# Patient Record
Sex: Male | Born: 1980 | Race: White | Marital: Single | Smoking: Current every day smoker
Health system: Southern US, Community
[De-identification: ages and names within clinical notes are randomized; demographics above are authoritative.]

---

## 2015-02-26 ENCOUNTER — Emergency Department: Payer: No Typology Code available for payment source

## 2015-02-26 ENCOUNTER — Encounter: Payer: Self-pay | Admitting: Emergency Medicine

## 2015-02-26 ENCOUNTER — Emergency Department
Admission: EM | Admit: 2015-02-26 | Discharge: 2015-02-26 | Disposition: A | Discharge status: Home / Self Care | Payer: No Typology Code available for payment source | Attending: Student | Admitting: Student

## 2015-02-26 DIAGNOSIS — S70312A Abrasion, left thigh, initial encounter: Secondary | ICD-10-CM | POA: Insufficient documentation

## 2015-02-26 DIAGNOSIS — Y9389 Activity, other specified: Secondary | ICD-10-CM | POA: Insufficient documentation

## 2015-02-26 DIAGNOSIS — S50312A Abrasion of left elbow, initial encounter: Secondary | ICD-10-CM | POA: Insufficient documentation

## 2015-02-26 DIAGNOSIS — S40022A Contusion of left upper arm, initial encounter: Secondary | ICD-10-CM

## 2015-02-26 DIAGNOSIS — Z720 Tobacco use: Secondary | ICD-10-CM | POA: Insufficient documentation

## 2015-02-26 DIAGNOSIS — Y92410 Unspecified street and highway as the place of occurrence of the external cause: Secondary | ICD-10-CM | POA: Insufficient documentation

## 2015-02-26 DIAGNOSIS — S5002XA Contusion of left elbow, initial encounter: Secondary | ICD-10-CM | POA: Insufficient documentation

## 2015-02-26 DIAGNOSIS — T148XXA Other injury of unspecified body region, initial encounter: Secondary | ICD-10-CM

## 2015-02-26 DIAGNOSIS — S40812A Abrasion of left upper arm, initial encounter: Secondary | ICD-10-CM

## 2015-02-26 DIAGNOSIS — Z880 Allergy status to penicillin: Secondary | ICD-10-CM | POA: Diagnosis not present

## 2015-02-26 DIAGNOSIS — Z23 Encounter for immunization: Secondary | ICD-10-CM | POA: Insufficient documentation

## 2015-02-26 DIAGNOSIS — S20312A Abrasion of left front wall of thorax, initial encounter: Secondary | ICD-10-CM | POA: Diagnosis not present

## 2015-02-26 DIAGNOSIS — Y998 Other external cause status: Secondary | ICD-10-CM | POA: Diagnosis not present

## 2015-02-26 DIAGNOSIS — S51002A Unspecified open wound of left elbow, initial encounter: Secondary | ICD-10-CM | POA: Diagnosis not present

## 2015-02-26 DIAGNOSIS — S59902A Unspecified injury of left elbow, initial encounter: Secondary | ICD-10-CM | POA: Diagnosis present

## 2015-02-26 MED ORDER — OXYCODONE-ACETAMINOPHEN 5-325 MG PO TABS: 1.0000 | ORAL_TABLET | Freq: Three times a day (TID) | ORAL | Status: DC | PRN

## 2015-02-26 MED ORDER — OXYCODONE-ACETAMINOPHEN 5-325 MG PO TABS
1.0000 | ORAL_TABLET | Freq: Once | ORAL | Status: AC
  Administered 2015-02-26: 1 via ORAL
  Filled 2015-02-26: qty 1

## 2015-02-26 MED ORDER — SULFAMETHOXAZOLE-TRIMETHOPRIM 400-80 MG PO TABS: 1.0000 | ORAL_TABLET | Freq: Two times a day (BID) | ORAL | Status: DC

## 2015-02-26 MED ORDER — TRAMADOL HCL 50 MG PO TABS: 50.0000 mg | ORAL_TABLET | Freq: Once | ORAL | Status: DC

## 2015-02-26 MED ORDER — SILVER SULFADIAZINE 1 % EX CREA: TOPICAL_CREAM | Freq: Two times a day (BID) | CUTANEOUS | Status: AC

## 2015-02-26 MED ORDER — IBUPROFEN 800 MG PO TABS: 800.0000 mg | ORAL_TABLET | Freq: Three times a day (TID) | ORAL | Status: DC | PRN

## 2015-02-26 MED ORDER — SILVER SULFADIAZINE 1 % EX CREA
TOPICAL_CREAM | Freq: Once | CUTANEOUS | Status: AC
  Administered 2015-02-26: 13:00:00 via TOPICAL
  Filled 2015-02-26: qty 85

## 2015-02-26 MED ORDER — TETANUS-DIPHTH-ACELL PERTUSSIS 5-2.5-18.5 LF-MCG/0.5 IM SUSP
0.5000 mL | Freq: Once | INTRAMUSCULAR | Status: AC
  Administered 2015-02-26: 0.5 mL via INTRAMUSCULAR
  Filled 2015-02-26: qty 0.5
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
## 2015-02-26 NOTE — ED Notes (Signed)
Pt reports that tramadol causes itching. Nurse adds medication to patients allergy list. Hyacinth MeekerMiller, NP informed new verbal order received.

## 2015-02-26 NOTE — Discharge Instructions (Signed)
Take medication as prescribed. Keep the wounds clean with soap and water daily, then rinse and pat dry. Apply topical antibiotic ointment as prescribed. Monitor wound.  Follow-up in 3 days with wound clinic. See above to call to schedule for follow-up. Return to the ER in 3-4 days if unable to be seen by wound clinic for wound check. Return to the ER and sooner for new or worsening concerns.  Contusion A contusion is a deep bruise. Contusions happen when an injury causes bleeding under the skin. Signs of bruising include pain, puffiness (swelling), and discolored skin. The contusion may turn blue, purple, or yellow. HOME CARE   Put ice on the injured area.  Put ice in a plastic bag.  Place a towel between your skin and the bag.  Leave the ice on for 15-20 minutes, 03-04 times a day.  Only take medicine as told by your doctor.  Rest the injured area.  If possible, raise (elevate) the injured area to lessen puffiness. GET HELP RIGHT AWAY IF:   You have more bruising or puffiness.  You have pain that is getting worse.  Your puffiness or pain is not helped by medicine. MAKE SURE YOU:   Understand these instructions.  Will watch your condition.  Will get help right away if you are not doing well or get worse. Document Released: 01/16/2008 Document Revised: 10/22/2011 Document Reviewed: 06/04/2011 [facility] Patient Information 2015 [place], [place]. This information is not intended to replace advice given to you by your health care provider. Make sure you discuss any questions you have with your health care provider.  Abrasions An abrasion is a cut or scrape of the skin. Abrasions do not go through all layers of the skin. HOME CARE  If a bandage (dressing) was put on your wound, change it as told by your doctor. If the bandage sticks, soak it off with warm.  Wash the area with water and soap 2 times a day. Rinse off the soap. Pat the area dry with a clean towel.  Put on  medicated cream (ointment) as told by your doctor.  Change your bandage right away if it gets wet or dirty.  Only take medicine as told by your doctor.  See your doctor within 24-48 hours to get your wound checked.  Check your wound for redness, puffiness (swelling), or yellowish-white fluid (pus). GET HELP RIGHT AWAY IF:   You have more pain in the wound.  You have redness, swelling, or tenderness around the wound.  You have pus coming from the wound.  You have a fever or lasting symptoms for more than 2-3 days.  You have a fever and your symptoms suddenly get worse.  You have a bad smell coming from the wound or bandage. MAKE SURE YOU:   Understand these instructions.  Will watch your condition.  Will get help right away if you are not doing well or get worse. Document Released: 01/16/2008 Document Revised: 04/23/2012 Document Reviewed: 07/03/2011 [facility] Patient Information 2015 [place], [place]. This information is not intended to replace advice given to you by your health care provider. Make sure you discuss any questions you have with your health care provider.  Wound Care Wound care helps prevent pain and infection.  You may need a tetanus shot if:  You cannot remember when you had your last tetanus shot.  You have never had a tetanus shot.  The injury broke your skin. If you need a tetanus shot and you choose not to have  one, you may get tetanus. Sickness from tetanus can be serious. HOME CARE   Only take medicine as told by your doctor.  Clean the wound daily with mild soap and water.  Change any bandages (dressings) as told by your doctor.  Put medicated cream and a bandage on the wound as told by your doctor.  Change the bandage if it gets wet, dirty, or starts to smell.  Take showers. Do not take baths, swim, or do anything that puts your wound under water.  Rest and raise (elevate) the wound until the pain and puffiness (swelling) are  better.  Keep all doctor visits as told. GET HELP RIGHT AWAY IF:   Yellowish-white fluid (pus) comes from the wound.  Medicine does not lessen your pain.  There is a red streak going away from the wound.  You have a fever. MAKE SURE YOU:   Understand these instructions.  Will watch your condition.  Will get help right away if you are not doing well or get worse. Document Released: 05/08/2008 Document Revised: 10/22/2011 Document Reviewed: 12/03/2010 [facility] Patient Information 2015 [place]. This information is not intended to replace advice given to you by your health care provider. Make sure you discuss any questions you have with your health care provider.

## 2015-02-26 NOTE — ED Notes (Signed)
Nurse soaks wounds in saline and betadine per  Order.
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
## 2021-09-12 ENCOUNTER — Emergency Department: Payer: No Typology Code available for payment source

## 2021-09-12 ENCOUNTER — Emergency Department
Admission: EM | Admit: 2021-09-12 | Discharge: 2021-09-12 | Disposition: A | Discharge status: Home / Self Care | Payer: No Typology Code available for payment source | Attending: Emergency Medicine | Admitting: Emergency Medicine

## 2021-09-12 ENCOUNTER — Other Ambulatory Visit: Payer: Self-pay

## 2021-09-12 DIAGNOSIS — S199XXA Unspecified injury of neck, initial encounter: Secondary | ICD-10-CM | POA: Diagnosis present

## 2021-09-12 DIAGNOSIS — S161XXA Strain of muscle, fascia and tendon at neck level, initial encounter: Secondary | ICD-10-CM | POA: Insufficient documentation

## 2021-09-12 DIAGNOSIS — S39012A Strain of muscle, fascia and tendon of lower back, initial encounter: Secondary | ICD-10-CM | POA: Diagnosis not present

## 2021-09-12 DIAGNOSIS — Y92410 Unspecified street and highway as the place of occurrence of the external cause: Secondary | ICD-10-CM | POA: Diagnosis not present

## 2021-09-12 MED ORDER — OXYCODONE-ACETAMINOPHEN 5-325 MG PO TABS: 1.0000 | ORAL_TABLET | Freq: Four times a day (QID) | ORAL | 0 refills | Status: AC | PRN

## 2021-09-12 MED ORDER — OXYCODONE-ACETAMINOPHEN 7.5-325 MG PO TABS
1.0000 | ORAL_TABLET | Freq: Once | ORAL | Status: AC
  Administered 2021-09-12: 1 via ORAL
  Filled 2021-09-12: qty 1

## 2021-09-12 MED ORDER — METHOCARBAMOL 500 MG PO TABS: 500.0000 mg | ORAL_TABLET | Freq: Four times a day (QID) | ORAL | 0 refills | Status: AC
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
## 2021-09-12 NOTE — Discharge Instructions (Addendum)
Follow-up with your primary care provider or [facility] acute care any continued problems.  Use ice or heat to your muscles as needed for discomfort.  2 prescriptions were sent to your pharmacy.  1 is a muscle relaxant to take 4 times a day and oxycodone-acetaminophen as needed for pain every 6 hours.  Do not drive or operate machinery when you are taking this medication as it could cause drowsiness and increase your risk for injury.
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
